# Patient Record
Sex: Male | Born: 1940
Health system: Southern US, Community
[De-identification: ages and names within clinical notes are randomized; demographics above are authoritative.]

---

## 2001-07-28 ENCOUNTER — Encounter: Payer: Self-pay | Admitting: *Deleted

## 2001-07-28 ENCOUNTER — Emergency Department (HOSPITAL_COMMUNITY): Admission: EM | Admit: 2001-07-28 | Discharge: 2001-07-28 | Payer: Self-pay | Admitting: *Deleted

## 2008-02-27 ENCOUNTER — Ambulatory Visit: Payer: Self-pay | Admitting: Internal Medicine

## 2008-02-27 ENCOUNTER — Encounter: Payer: Self-pay | Admitting: Internal Medicine

## 2008-02-27 ENCOUNTER — Ambulatory Visit (HOSPITAL_COMMUNITY): Admission: AD | Admit: 2008-02-27 | Discharge: 2008-02-27 | Payer: Self-pay | Admitting: Internal Medicine

## 2008-08-08 ENCOUNTER — Ambulatory Visit: Admission: RE | Admit: 2008-08-08 | Discharge: 2008-08-08 | Payer: Self-pay | Admitting: Internal Medicine

## 2010-09-23 NOTE — Procedures (Signed)
NAME:  Jerry Villanueva, Jerry Villanueva NO.:  [id number]   MEDICAL RECORD NO.:  [id number]          PATIENT TYPE:  OUT   LOCATION:  SLEE                          FACILITY:  [facility]   PHYSICIAN:  Kofi A. Gerilyn Pilgrim, M.D. DATE OF BIRTH:  [birth year]   DATE OF PROCEDURE:  08/08/2008  DATE OF DISCHARGE:  08/08/2008                             SLEEP DISORDER REPORT   INDICATIONS FOR PROCEDURE:  This is a 70 year old man who presents with  snoring, hypersomnia and is being evaluated for obstructive sleep apnea  syndrome.   MEDICATIONS:  Lisinopril.   EPWORTH SLEEPINESS SCALE:  1. BMI 34.   ARCHITECTURAL SUMMARY:  This is a diagnostic study.  The total recording  time is 410 minutes.  The sleep efficiency is 88%.  Sleep latency 4.5  minutes.  REM latency 70 minutes.  Stage N1 10%, N2 71%, N3 2% and REM  sleep 17%.   RESPIRATORY SUMMARY:  Oxygen saturation at baseline is 97%.  Lowest  saturation 81% during non-REM sleep.  AHI is 6.2.   LIMB MOVEMENT SUMMARY:  PLM index is 2.   ELECTROCARDIOGRAM SUMMARY:  Average heart rate is 62 with isolated PVCs  observed.   IMPRESSION:  Mild obstructive sleep apnea syndrome, not requiring  positive pressure.   Thank you for this referral.      Kofi A. Gerilyn Pilgrim, M.D.  Electronically Signed     KAD/MEDQ  D:  08/13/2008  T:  08/13/2008  Job:  [id number]

## 2010-09-23 NOTE — Op Note (Signed)
NAME:  CHRISTAIN, NIZNIK NO.:  [id number]   MEDICAL RECORD NO.:  [id number]          PATIENT TYPE:  AMB   LOCATION:  DAY                           FACILITY:  [facility]   PHYSICIAN:  R. Roetta Sessions, M.D. DATE OF BIRTH:  05-Jan-1941   DATE OF PROCEDURE:  DATE OF DISCHARGE:                               OPERATIVE REPORT   Colonoscopy with biopsy.   INDICATIONS FOR PROCEDURE:  A 70 year old gentleman, no lower GI tract  symptoms.  He has never had his lower GI tract imaged via colonoscopy or  other modality, now referred by Dr. Ouida Sills for colon cancer screening.  There is no family history of colorectal neoplasia.  Colonoscopy is now  being done as screening maneuver.  Risks, benefits, alternatives, and  limitations have been reviewed, questions answered and she is agreeable.  Please see documentation in the medical record.   PROCEDURE NOTE:  O2 saturation, blood pressure, pulse, and respirations  were monitored throughout the entire procedure.   CONSCIOUS SEDATION:  Versed 4 mg IV, Demerol 75 mg IV in divided doses.   INSTRUMENT:  Pentax video chip system.   FINDINGS:  Digital rectal exam revealed no abnormalities.  Endoscopic  findings:  The prep was good.  Colon:  Colonic mucosa was surveyed from  the rectosigmoid junction through the left transverse, right colon,  appendiceal orifice, ileocecal valve, and cecum.  These structures were  well seen and photographed for the record.  From this level, the scope  was slowly and cautiously withdrawn.  All previously mentioned mucosal  surfaces were again seen.  The colonic mucosa appeared normal except for  a 2-3 mm sessile polyp at the base of the cecum which was completely  removed with 2 passes of cold biopsy forceps.  Scope was pulled down to  the rectum.  A thorough examination of the rectal mucosa including  retroflex view of the anal verge demonstrated no abnormalities.  The  patient tolerated the procedure well, was  reactive to endoscopy.   IMPRESSION:  Normal colon, single cecal polyp status post cold biopsy  removal, remainder of colonic mucosa appeared normal.   RECOMMENDATIONS:  1. Followup on path.  2. Further recommendations to follow.      Jonathon Bellows, M.D.  Electronically Signed     RMR/MEDQ  D:  02/27/2008  T:  02/27/2008  Job:  [id number]   cc:   Kingsley Callander. Ouida Sills, MD  Fax: [phone]

## 2011-08-10 DIAGNOSIS — M47817 Spondylosis without myelopathy or radiculopathy, lumbosacral region: Secondary | ICD-10-CM | POA: Diagnosis not present

## 2011-08-14 DIAGNOSIS — M5137 Other intervertebral disc degeneration, lumbosacral region: Secondary | ICD-10-CM | POA: Diagnosis not present

## 2011-08-14 DIAGNOSIS — M5126 Other intervertebral disc displacement, lumbar region: Secondary | ICD-10-CM | POA: Diagnosis not present

## 2011-08-14 DIAGNOSIS — M4120 Other idiopathic scoliosis, site unspecified: Secondary | ICD-10-CM | POA: Diagnosis not present

## 2011-08-14 DIAGNOSIS — M47817 Spondylosis without myelopathy or radiculopathy, lumbosacral region: Secondary | ICD-10-CM | POA: Diagnosis not present

## 2011-08-19 DIAGNOSIS — N209 Urinary calculus, unspecified: Secondary | ICD-10-CM | POA: Diagnosis not present

## 2011-08-19 DIAGNOSIS — M546 Pain in thoracic spine: Secondary | ICD-10-CM | POA: Diagnosis not present

## 2011-08-19 DIAGNOSIS — M5137 Other intervertebral disc degeneration, lumbosacral region: Secondary | ICD-10-CM | POA: Diagnosis not present

## 2011-08-19 DIAGNOSIS — M47817 Spondylosis without myelopathy or radiculopathy, lumbosacral region: Secondary | ICD-10-CM | POA: Diagnosis not present

## 2011-08-19 DIAGNOSIS — M4120 Other idiopathic scoliosis, site unspecified: Secondary | ICD-10-CM | POA: Diagnosis not present

## 2011-09-07 DIAGNOSIS — M5380 Other specified dorsopathies, site unspecified: Secondary | ICD-10-CM | POA: Diagnosis not present

## 2011-09-07 DIAGNOSIS — M4120 Other idiopathic scoliosis, site unspecified: Secondary | ICD-10-CM | POA: Diagnosis not present

## 2011-09-07 DIAGNOSIS — M47817 Spondylosis without myelopathy or radiculopathy, lumbosacral region: Secondary | ICD-10-CM | POA: Diagnosis not present

## 2011-09-15 DIAGNOSIS — M4120 Other idiopathic scoliosis, site unspecified: Secondary | ICD-10-CM | POA: Diagnosis not present

## 2011-09-15 DIAGNOSIS — M5137 Other intervertebral disc degeneration, lumbosacral region: Secondary | ICD-10-CM | POA: Diagnosis not present

## 2011-09-15 DIAGNOSIS — M5380 Other specified dorsopathies, site unspecified: Secondary | ICD-10-CM | POA: Diagnosis not present

## 2011-10-07 DIAGNOSIS — M4120 Other idiopathic scoliosis, site unspecified: Secondary | ICD-10-CM | POA: Diagnosis not present

## 2011-10-07 DIAGNOSIS — M47817 Spondylosis without myelopathy or radiculopathy, lumbosacral region: Secondary | ICD-10-CM | POA: Diagnosis not present

## 2011-10-07 DIAGNOSIS — M5380 Other specified dorsopathies, site unspecified: Secondary | ICD-10-CM | POA: Diagnosis not present

## 2011-10-15 DIAGNOSIS — I10 Essential (primary) hypertension: Secondary | ICD-10-CM | POA: Diagnosis not present

## 2011-10-15 DIAGNOSIS — M5380 Other specified dorsopathies, site unspecified: Secondary | ICD-10-CM | POA: Diagnosis not present

## 2011-10-15 DIAGNOSIS — M4120 Other idiopathic scoliosis, site unspecified: Secondary | ICD-10-CM | POA: Diagnosis not present

## 2011-10-15 DIAGNOSIS — M47817 Spondylosis without myelopathy or radiculopathy, lumbosacral region: Secondary | ICD-10-CM | POA: Diagnosis not present

## 2011-10-29 DIAGNOSIS — M47817 Spondylosis without myelopathy or radiculopathy, lumbosacral region: Secondary | ICD-10-CM | POA: Diagnosis not present

## 2011-10-29 DIAGNOSIS — Z79899 Other long term (current) drug therapy: Secondary | ICD-10-CM | POA: Diagnosis not present

## 2011-10-29 DIAGNOSIS — M5137 Other intervertebral disc degeneration, lumbosacral region: Secondary | ICD-10-CM | POA: Diagnosis not present

## 2011-10-29 DIAGNOSIS — IMO0001 Reserved for inherently not codable concepts without codable children: Secondary | ICD-10-CM | POA: Diagnosis not present

## 2011-11-06 DIAGNOSIS — M4120 Other idiopathic scoliosis, site unspecified: Secondary | ICD-10-CM | POA: Diagnosis not present

## 2011-11-06 DIAGNOSIS — M47817 Spondylosis without myelopathy or radiculopathy, lumbosacral region: Secondary | ICD-10-CM | POA: Diagnosis not present

## 2011-11-06 DIAGNOSIS — M5137 Other intervertebral disc degeneration, lumbosacral region: Secondary | ICD-10-CM | POA: Diagnosis not present

## 2011-11-06 DIAGNOSIS — Z79899 Other long term (current) drug therapy: Secondary | ICD-10-CM | POA: Diagnosis not present

## 2012-04-06 DIAGNOSIS — E785 Hyperlipidemia, unspecified: Secondary | ICD-10-CM | POA: Diagnosis not present

## 2012-04-06 DIAGNOSIS — Z79899 Other long term (current) drug therapy: Secondary | ICD-10-CM | POA: Diagnosis not present

## 2012-04-06 DIAGNOSIS — I10 Essential (primary) hypertension: Secondary | ICD-10-CM | POA: Diagnosis not present

## 2012-04-15 DIAGNOSIS — M545 Low back pain: Secondary | ICD-10-CM | POA: Diagnosis not present

## 2012-04-15 DIAGNOSIS — I10 Essential (primary) hypertension: Secondary | ICD-10-CM | POA: Diagnosis not present

## 2012-04-28 DIAGNOSIS — M47817 Spondylosis without myelopathy or radiculopathy, lumbosacral region: Secondary | ICD-10-CM | POA: Diagnosis not present

## 2012-04-28 DIAGNOSIS — M4120 Other idiopathic scoliosis, site unspecified: Secondary | ICD-10-CM | POA: Diagnosis not present

## 2012-04-28 DIAGNOSIS — M5380 Other specified dorsopathies, site unspecified: Secondary | ICD-10-CM | POA: Diagnosis not present

## 2012-06-30 DIAGNOSIS — M545 Low back pain: Secondary | ICD-10-CM | POA: Diagnosis not present

## 2012-06-30 DIAGNOSIS — I10 Essential (primary) hypertension: Secondary | ICD-10-CM | POA: Diagnosis not present

## 2012-08-18 DIAGNOSIS — M4120 Other idiopathic scoliosis, site unspecified: Secondary | ICD-10-CM | POA: Diagnosis not present

## 2012-08-18 DIAGNOSIS — M47817 Spondylosis without myelopathy or radiculopathy, lumbosacral region: Secondary | ICD-10-CM | POA: Diagnosis not present

## 2012-10-07 DIAGNOSIS — E669 Obesity, unspecified: Secondary | ICD-10-CM | POA: Diagnosis not present

## 2012-10-07 DIAGNOSIS — M47817 Spondylosis without myelopathy or radiculopathy, lumbosacral region: Secondary | ICD-10-CM | POA: Diagnosis not present

## 2012-10-07 DIAGNOSIS — Z79899 Other long term (current) drug therapy: Secondary | ICD-10-CM | POA: Diagnosis not present

## 2012-10-07 DIAGNOSIS — J9819 Other pulmonary collapse: Secondary | ICD-10-CM | POA: Diagnosis not present

## 2012-10-07 DIAGNOSIS — Z01818 Encounter for other preprocedural examination: Secondary | ICD-10-CM | POA: Diagnosis not present

## 2012-10-07 DIAGNOSIS — I10 Essential (primary) hypertension: Secondary | ICD-10-CM | POA: Diagnosis not present

## 2012-10-11 DIAGNOSIS — M4310 Spondylolisthesis, site unspecified: Secondary | ICD-10-CM | POA: Diagnosis not present

## 2012-10-11 DIAGNOSIS — S2092XA Blister (nonthermal) of unspecified parts of thorax, initial encounter: Secondary | ICD-10-CM | POA: Diagnosis not present

## 2012-10-11 DIAGNOSIS — Z6841 Body Mass Index (BMI) 40.0 and over, adult: Secondary | ICD-10-CM | POA: Diagnosis not present

## 2012-10-11 DIAGNOSIS — M539 Dorsopathy, unspecified: Secondary | ICD-10-CM | POA: Diagnosis not present

## 2012-10-11 DIAGNOSIS — Z79899 Other long term (current) drug therapy: Secondary | ICD-10-CM | POA: Diagnosis not present

## 2012-10-11 DIAGNOSIS — M4120 Other idiopathic scoliosis, site unspecified: Secondary | ICD-10-CM | POA: Diagnosis not present

## 2012-10-11 DIAGNOSIS — R0609 Other forms of dyspnea: Secondary | ICD-10-CM | POA: Diagnosis not present

## 2012-10-11 DIAGNOSIS — I10 Essential (primary) hypertension: Secondary | ICD-10-CM | POA: Diagnosis not present

## 2012-10-11 DIAGNOSIS — M47817 Spondylosis without myelopathy or radiculopathy, lumbosacral region: Secondary | ICD-10-CM | POA: Diagnosis not present

## 2012-10-11 DIAGNOSIS — M959 Acquired deformity of musculoskeletal system, unspecified: Secondary | ICD-10-CM | POA: Diagnosis not present

## 2012-10-11 DIAGNOSIS — R4182 Altered mental status, unspecified: Secondary | ICD-10-CM | POA: Diagnosis not present

## 2012-10-11 DIAGNOSIS — Z833 Family history of diabetes mellitus: Secondary | ICD-10-CM | POA: Diagnosis not present

## 2012-10-18 DIAGNOSIS — Z5189 Encounter for other specified aftercare: Secondary | ICD-10-CM | POA: Diagnosis not present

## 2012-10-18 DIAGNOSIS — Z4789 Encounter for other orthopedic aftercare: Secondary | ICD-10-CM | POA: Diagnosis not present

## 2012-10-19 DIAGNOSIS — Z5189 Encounter for other specified aftercare: Secondary | ICD-10-CM | POA: Diagnosis not present

## 2012-10-19 DIAGNOSIS — Z4789 Encounter for other orthopedic aftercare: Secondary | ICD-10-CM | POA: Diagnosis not present

## 2012-10-21 DIAGNOSIS — M5137 Other intervertebral disc degeneration, lumbosacral region: Secondary | ICD-10-CM | POA: Diagnosis not present

## 2012-10-21 DIAGNOSIS — Z981 Arthrodesis status: Secondary | ICD-10-CM | POA: Diagnosis not present

## 2012-10-21 DIAGNOSIS — M47817 Spondylosis without myelopathy or radiculopathy, lumbosacral region: Secondary | ICD-10-CM | POA: Diagnosis not present

## 2012-10-24 DIAGNOSIS — Z5189 Encounter for other specified aftercare: Secondary | ICD-10-CM | POA: Diagnosis not present

## 2012-10-24 DIAGNOSIS — Z4789 Encounter for other orthopedic aftercare: Secondary | ICD-10-CM | POA: Diagnosis not present

## 2012-10-26 DIAGNOSIS — Z5189 Encounter for other specified aftercare: Secondary | ICD-10-CM | POA: Diagnosis not present

## 2012-10-26 DIAGNOSIS — Z4789 Encounter for other orthopedic aftercare: Secondary | ICD-10-CM | POA: Diagnosis not present

## 2012-10-27 DIAGNOSIS — Z4789 Encounter for other orthopedic aftercare: Secondary | ICD-10-CM | POA: Diagnosis not present

## 2012-10-27 DIAGNOSIS — Z5189 Encounter for other specified aftercare: Secondary | ICD-10-CM | POA: Diagnosis not present

## 2012-10-28 DIAGNOSIS — M7989 Other specified soft tissue disorders: Secondary | ICD-10-CM | POA: Diagnosis not present

## 2012-10-28 DIAGNOSIS — M5380 Other specified dorsopathies, site unspecified: Secondary | ICD-10-CM | POA: Diagnosis not present

## 2012-10-28 DIAGNOSIS — M79609 Pain in unspecified limb: Secondary | ICD-10-CM | POA: Diagnosis not present

## 2012-10-30 DIAGNOSIS — Z5189 Encounter for other specified aftercare: Secondary | ICD-10-CM | POA: Diagnosis not present

## 2012-10-30 DIAGNOSIS — Z4789 Encounter for other orthopedic aftercare: Secondary | ICD-10-CM | POA: Diagnosis not present

## 2012-11-03 DIAGNOSIS — Z5189 Encounter for other specified aftercare: Secondary | ICD-10-CM | POA: Diagnosis not present

## 2012-11-03 DIAGNOSIS — Z4789 Encounter for other orthopedic aftercare: Secondary | ICD-10-CM | POA: Diagnosis not present

## 2012-11-04 DIAGNOSIS — M47817 Spondylosis without myelopathy or radiculopathy, lumbosacral region: Secondary | ICD-10-CM | POA: Diagnosis not present

## 2012-11-04 DIAGNOSIS — M4120 Other idiopathic scoliosis, site unspecified: Secondary | ICD-10-CM | POA: Diagnosis not present

## 2012-11-04 DIAGNOSIS — Z981 Arthrodesis status: Secondary | ICD-10-CM | POA: Diagnosis not present

## 2012-11-08 DIAGNOSIS — Z5189 Encounter for other specified aftercare: Secondary | ICD-10-CM | POA: Diagnosis not present

## 2012-11-08 DIAGNOSIS — Z4789 Encounter for other orthopedic aftercare: Secondary | ICD-10-CM | POA: Diagnosis not present

## 2012-11-09 DIAGNOSIS — Z5189 Encounter for other specified aftercare: Secondary | ICD-10-CM | POA: Diagnosis not present

## 2012-11-09 DIAGNOSIS — Z4789 Encounter for other orthopedic aftercare: Secondary | ICD-10-CM | POA: Diagnosis not present

## 2012-11-10 DIAGNOSIS — Z4789 Encounter for other orthopedic aftercare: Secondary | ICD-10-CM | POA: Diagnosis not present

## 2012-11-10 DIAGNOSIS — Z5189 Encounter for other specified aftercare: Secondary | ICD-10-CM | POA: Diagnosis not present

## 2012-11-11 DIAGNOSIS — Z4789 Encounter for other orthopedic aftercare: Secondary | ICD-10-CM | POA: Diagnosis not present

## 2012-11-11 DIAGNOSIS — Z5189 Encounter for other specified aftercare: Secondary | ICD-10-CM | POA: Diagnosis not present

## 2012-11-15 DIAGNOSIS — Z5189 Encounter for other specified aftercare: Secondary | ICD-10-CM | POA: Diagnosis not present

## 2012-11-15 DIAGNOSIS — Z4789 Encounter for other orthopedic aftercare: Secondary | ICD-10-CM | POA: Diagnosis not present

## 2012-11-17 DIAGNOSIS — Z5189 Encounter for other specified aftercare: Secondary | ICD-10-CM | POA: Diagnosis not present

## 2012-11-17 DIAGNOSIS — Z4789 Encounter for other orthopedic aftercare: Secondary | ICD-10-CM | POA: Diagnosis not present

## 2012-11-22 DIAGNOSIS — Z4789 Encounter for other orthopedic aftercare: Secondary | ICD-10-CM | POA: Diagnosis not present

## 2012-11-22 DIAGNOSIS — Z5189 Encounter for other specified aftercare: Secondary | ICD-10-CM | POA: Diagnosis not present

## 2012-11-24 DIAGNOSIS — Z5189 Encounter for other specified aftercare: Secondary | ICD-10-CM | POA: Diagnosis not present

## 2012-11-24 DIAGNOSIS — Z4789 Encounter for other orthopedic aftercare: Secondary | ICD-10-CM | POA: Diagnosis not present

## 2012-11-29 DIAGNOSIS — Z5189 Encounter for other specified aftercare: Secondary | ICD-10-CM | POA: Diagnosis not present

## 2012-11-29 DIAGNOSIS — Z4789 Encounter for other orthopedic aftercare: Secondary | ICD-10-CM | POA: Diagnosis not present

## 2012-12-02 DIAGNOSIS — Z4789 Encounter for other orthopedic aftercare: Secondary | ICD-10-CM | POA: Diagnosis not present

## 2012-12-02 DIAGNOSIS — Z5189 Encounter for other specified aftercare: Secondary | ICD-10-CM | POA: Diagnosis not present

## 2012-12-07 DIAGNOSIS — Z5189 Encounter for other specified aftercare: Secondary | ICD-10-CM | POA: Diagnosis not present

## 2012-12-07 DIAGNOSIS — Z4789 Encounter for other orthopedic aftercare: Secondary | ICD-10-CM | POA: Diagnosis not present

## 2012-12-08 DIAGNOSIS — M48061 Spinal stenosis, lumbar region without neurogenic claudication: Secondary | ICD-10-CM | POA: Diagnosis not present

## 2012-12-08 DIAGNOSIS — M47817 Spondylosis without myelopathy or radiculopathy, lumbosacral region: Secondary | ICD-10-CM | POA: Diagnosis not present

## 2012-12-08 DIAGNOSIS — Z981 Arthrodesis status: Secondary | ICD-10-CM | POA: Diagnosis not present

## 2012-12-16 DIAGNOSIS — R262 Difficulty in walking, not elsewhere classified: Secondary | ICD-10-CM | POA: Diagnosis not present

## 2012-12-16 DIAGNOSIS — M545 Low back pain: Secondary | ICD-10-CM | POA: Diagnosis not present

## 2012-12-19 DIAGNOSIS — M545 Low back pain: Secondary | ICD-10-CM | POA: Diagnosis not present

## 2012-12-19 DIAGNOSIS — R262 Difficulty in walking, not elsewhere classified: Secondary | ICD-10-CM | POA: Diagnosis not present

## 2012-12-22 DIAGNOSIS — R262 Difficulty in walking, not elsewhere classified: Secondary | ICD-10-CM | POA: Diagnosis not present

## 2012-12-22 DIAGNOSIS — M545 Low back pain: Secondary | ICD-10-CM | POA: Diagnosis not present

## 2012-12-26 DIAGNOSIS — M545 Low back pain: Secondary | ICD-10-CM | POA: Diagnosis not present

## 2012-12-26 DIAGNOSIS — R262 Difficulty in walking, not elsewhere classified: Secondary | ICD-10-CM | POA: Diagnosis not present

## 2012-12-28 DIAGNOSIS — M545 Low back pain: Secondary | ICD-10-CM | POA: Diagnosis not present

## 2012-12-28 DIAGNOSIS — R262 Difficulty in walking, not elsewhere classified: Secondary | ICD-10-CM | POA: Diagnosis not present

## 2013-01-03 DIAGNOSIS — M545 Low back pain: Secondary | ICD-10-CM | POA: Diagnosis not present

## 2013-01-03 DIAGNOSIS — R262 Difficulty in walking, not elsewhere classified: Secondary | ICD-10-CM | POA: Diagnosis not present

## 2013-01-05 DIAGNOSIS — M545 Low back pain: Secondary | ICD-10-CM | POA: Diagnosis not present

## 2013-01-05 DIAGNOSIS — R262 Difficulty in walking, not elsewhere classified: Secondary | ICD-10-CM | POA: Diagnosis not present

## 2013-01-10 DIAGNOSIS — R262 Difficulty in walking, not elsewhere classified: Secondary | ICD-10-CM | POA: Diagnosis not present

## 2013-01-10 DIAGNOSIS — M545 Low back pain: Secondary | ICD-10-CM | POA: Diagnosis not present

## 2013-01-11 DIAGNOSIS — R262 Difficulty in walking, not elsewhere classified: Secondary | ICD-10-CM | POA: Diagnosis not present

## 2013-01-11 DIAGNOSIS — M545 Low back pain: Secondary | ICD-10-CM | POA: Diagnosis not present

## 2013-01-12 DIAGNOSIS — L738 Other specified follicular disorders: Secondary | ICD-10-CM | POA: Diagnosis not present

## 2013-01-12 DIAGNOSIS — D235 Other benign neoplasm of skin of trunk: Secondary | ICD-10-CM | POA: Diagnosis not present

## 2013-01-16 DIAGNOSIS — M545 Low back pain: Secondary | ICD-10-CM | POA: Diagnosis not present

## 2013-01-16 DIAGNOSIS — R262 Difficulty in walking, not elsewhere classified: Secondary | ICD-10-CM | POA: Diagnosis not present

## 2013-01-18 DIAGNOSIS — R262 Difficulty in walking, not elsewhere classified: Secondary | ICD-10-CM | POA: Diagnosis not present

## 2013-01-18 DIAGNOSIS — M545 Low back pain: Secondary | ICD-10-CM | POA: Diagnosis not present

## 2013-01-23 DIAGNOSIS — M545 Low back pain: Secondary | ICD-10-CM | POA: Diagnosis not present

## 2013-01-23 DIAGNOSIS — R262 Difficulty in walking, not elsewhere classified: Secondary | ICD-10-CM | POA: Diagnosis not present

## 2013-01-25 DIAGNOSIS — R262 Difficulty in walking, not elsewhere classified: Secondary | ICD-10-CM | POA: Diagnosis not present

## 2013-01-25 DIAGNOSIS — M545 Low back pain: Secondary | ICD-10-CM | POA: Diagnosis not present

## 2013-01-30 DIAGNOSIS — R262 Difficulty in walking, not elsewhere classified: Secondary | ICD-10-CM | POA: Diagnosis not present

## 2013-01-30 DIAGNOSIS — M545 Low back pain: Secondary | ICD-10-CM | POA: Diagnosis not present

## 2013-02-01 DIAGNOSIS — R262 Difficulty in walking, not elsewhere classified: Secondary | ICD-10-CM | POA: Diagnosis not present

## 2013-02-01 DIAGNOSIS — M545 Low back pain: Secondary | ICD-10-CM | POA: Diagnosis not present

## 2013-02-06 DIAGNOSIS — R262 Difficulty in walking, not elsewhere classified: Secondary | ICD-10-CM | POA: Diagnosis not present

## 2013-02-06 DIAGNOSIS — M545 Low back pain: Secondary | ICD-10-CM | POA: Diagnosis not present

## 2013-02-08 DIAGNOSIS — M545 Low back pain: Secondary | ICD-10-CM | POA: Diagnosis not present

## 2013-02-08 DIAGNOSIS — R262 Difficulty in walking, not elsewhere classified: Secondary | ICD-10-CM | POA: Diagnosis not present

## 2013-02-13 DIAGNOSIS — M545 Low back pain: Secondary | ICD-10-CM | POA: Diagnosis not present

## 2013-02-13 DIAGNOSIS — R262 Difficulty in walking, not elsewhere classified: Secondary | ICD-10-CM | POA: Diagnosis not present

## 2013-02-15 DIAGNOSIS — R262 Difficulty in walking, not elsewhere classified: Secondary | ICD-10-CM | POA: Diagnosis not present

## 2013-02-15 DIAGNOSIS — M545 Low back pain: Secondary | ICD-10-CM | POA: Diagnosis not present

## 2013-02-20 DIAGNOSIS — M545 Low back pain: Secondary | ICD-10-CM | POA: Diagnosis not present

## 2013-02-20 DIAGNOSIS — R262 Difficulty in walking, not elsewhere classified: Secondary | ICD-10-CM | POA: Diagnosis not present

## 2013-02-22 DIAGNOSIS — Z23 Encounter for immunization: Secondary | ICD-10-CM | POA: Diagnosis not present

## 2013-02-22 DIAGNOSIS — R262 Difficulty in walking, not elsewhere classified: Secondary | ICD-10-CM | POA: Diagnosis not present

## 2013-02-22 DIAGNOSIS — Z981 Arthrodesis status: Secondary | ICD-10-CM | POA: Diagnosis not present

## 2013-02-22 DIAGNOSIS — M545 Low back pain: Secondary | ICD-10-CM | POA: Diagnosis not present

## 2013-02-22 DIAGNOSIS — M1710 Unilateral primary osteoarthritis, unspecified knee: Secondary | ICD-10-CM | POA: Diagnosis not present

## 2013-02-22 DIAGNOSIS — M25569 Pain in unspecified knee: Secondary | ICD-10-CM | POA: Diagnosis not present

## 2013-02-22 DIAGNOSIS — M169 Osteoarthritis of hip, unspecified: Secondary | ICD-10-CM | POA: Diagnosis not present

## 2013-02-22 DIAGNOSIS — M47817 Spondylosis without myelopathy or radiculopathy, lumbosacral region: Secondary | ICD-10-CM | POA: Diagnosis not present

## 2013-02-28 DIAGNOSIS — M545 Low back pain: Secondary | ICD-10-CM | POA: Diagnosis not present

## 2013-02-28 DIAGNOSIS — R262 Difficulty in walking, not elsewhere classified: Secondary | ICD-10-CM | POA: Diagnosis not present

## 2013-03-02 DIAGNOSIS — M47817 Spondylosis without myelopathy or radiculopathy, lumbosacral region: Secondary | ICD-10-CM | POA: Diagnosis not present

## 2013-03-02 DIAGNOSIS — M545 Low back pain: Secondary | ICD-10-CM | POA: Diagnosis not present

## 2013-03-02 DIAGNOSIS — R262 Difficulty in walking, not elsewhere classified: Secondary | ICD-10-CM | POA: Diagnosis not present

## 2013-03-15 DIAGNOSIS — M545 Low back pain: Secondary | ICD-10-CM | POA: Diagnosis not present

## 2013-03-15 DIAGNOSIS — R262 Difficulty in walking, not elsewhere classified: Secondary | ICD-10-CM | POA: Diagnosis not present

## 2013-06-08 DIAGNOSIS — M47817 Spondylosis without myelopathy or radiculopathy, lumbosacral region: Secondary | ICD-10-CM | POA: Diagnosis not present

## 2013-06-08 DIAGNOSIS — M5380 Other specified dorsopathies, site unspecified: Secondary | ICD-10-CM | POA: Diagnosis not present

## 2013-06-08 DIAGNOSIS — M4120 Other idiopathic scoliosis, site unspecified: Secondary | ICD-10-CM | POA: Diagnosis not present

## 2013-09-07 DIAGNOSIS — M5380 Other specified dorsopathies, site unspecified: Secondary | ICD-10-CM | POA: Diagnosis not present

## 2013-09-07 DIAGNOSIS — M4120 Other idiopathic scoliosis, site unspecified: Secondary | ICD-10-CM | POA: Diagnosis not present

## 2013-09-07 DIAGNOSIS — M47817 Spondylosis without myelopathy or radiculopathy, lumbosacral region: Secondary | ICD-10-CM | POA: Diagnosis not present

## 2013-12-19 DIAGNOSIS — M4120 Other idiopathic scoliosis, site unspecified: Secondary | ICD-10-CM | POA: Diagnosis not present

## 2013-12-19 DIAGNOSIS — M5380 Other specified dorsopathies, site unspecified: Secondary | ICD-10-CM | POA: Diagnosis not present

## 2013-12-19 DIAGNOSIS — M47817 Spondylosis without myelopathy or radiculopathy, lumbosacral region: Secondary | ICD-10-CM | POA: Diagnosis not present

## 2014-02-27 DIAGNOSIS — Z23 Encounter for immunization: Secondary | ICD-10-CM | POA: Diagnosis not present

## 2014-06-27 DIAGNOSIS — M419 Scoliosis, unspecified: Secondary | ICD-10-CM | POA: Diagnosis not present

## 2014-06-27 DIAGNOSIS — M545 Low back pain: Secondary | ICD-10-CM | POA: Diagnosis not present

## 2014-06-27 DIAGNOSIS — M179 Osteoarthritis of knee, unspecified: Secondary | ICD-10-CM | POA: Diagnosis not present

## 2014-06-27 DIAGNOSIS — M47817 Spondylosis without myelopathy or radiculopathy, lumbosacral region: Secondary | ICD-10-CM | POA: Diagnosis not present

## 2014-12-26 DIAGNOSIS — M47816 Spondylosis without myelopathy or radiculopathy, lumbar region: Secondary | ICD-10-CM | POA: Diagnosis not present

## 2014-12-26 DIAGNOSIS — M419 Scoliosis, unspecified: Secondary | ICD-10-CM | POA: Diagnosis not present

## 2014-12-26 DIAGNOSIS — M545 Low back pain: Secondary | ICD-10-CM | POA: Diagnosis not present

## 2015-02-23 DIAGNOSIS — Z23 Encounter for immunization: Secondary | ICD-10-CM | POA: Diagnosis not present

## 2015-10-24 DIAGNOSIS — M5136 Other intervertebral disc degeneration, lumbar region: Secondary | ICD-10-CM | POA: Diagnosis not present

## 2015-10-24 DIAGNOSIS — M5135 Other intervertebral disc degeneration, thoracolumbar region: Secondary | ICD-10-CM | POA: Diagnosis not present

## 2015-10-24 DIAGNOSIS — M47816 Spondylosis without myelopathy or radiculopathy, lumbar region: Secondary | ICD-10-CM | POA: Diagnosis not present

## 2015-10-24 DIAGNOSIS — Z9889 Other specified postprocedural states: Secondary | ICD-10-CM | POA: Diagnosis not present

## 2015-10-24 DIAGNOSIS — M419 Scoliosis, unspecified: Secondary | ICD-10-CM | POA: Diagnosis not present

## 2015-10-24 DIAGNOSIS — M545 Low back pain: Secondary | ICD-10-CM | POA: Diagnosis not present

## 2015-10-31 DIAGNOSIS — Z981 Arthrodesis status: Secondary | ICD-10-CM | POA: Diagnosis not present

## 2015-10-31 DIAGNOSIS — M5124 Other intervertebral disc displacement, thoracic region: Secondary | ICD-10-CM | POA: Diagnosis not present

## 2015-10-31 DIAGNOSIS — E041 Nontoxic single thyroid nodule: Secondary | ICD-10-CM | POA: Diagnosis not present

## 2015-10-31 DIAGNOSIS — M4806 Spinal stenosis, lumbar region: Secondary | ICD-10-CM | POA: Diagnosis not present

## 2015-10-31 DIAGNOSIS — N200 Calculus of kidney: Secondary | ICD-10-CM | POA: Diagnosis not present

## 2015-10-31 DIAGNOSIS — M419 Scoliosis, unspecified: Secondary | ICD-10-CM | POA: Diagnosis not present

## 2015-10-31 DIAGNOSIS — M4804 Spinal stenosis, thoracic region: Secondary | ICD-10-CM | POA: Diagnosis not present

## 2015-10-31 DIAGNOSIS — M47816 Spondylosis without myelopathy or radiculopathy, lumbar region: Secondary | ICD-10-CM | POA: Diagnosis not present

## 2015-11-18 DIAGNOSIS — M1711 Unilateral primary osteoarthritis, right knee: Secondary | ICD-10-CM | POA: Diagnosis not present

## 2015-11-25 DIAGNOSIS — M47816 Spondylosis without myelopathy or radiculopathy, lumbar region: Secondary | ICD-10-CM | POA: Diagnosis not present

## 2015-11-25 DIAGNOSIS — M545 Low back pain: Secondary | ICD-10-CM | POA: Diagnosis not present

## 2015-11-25 DIAGNOSIS — M419 Scoliosis, unspecified: Secondary | ICD-10-CM | POA: Diagnosis not present

## 2015-11-29 DIAGNOSIS — M545 Low back pain: Secondary | ICD-10-CM | POA: Diagnosis not present

## 2015-11-29 DIAGNOSIS — M79604 Pain in right leg: Secondary | ICD-10-CM | POA: Diagnosis not present

## 2015-11-29 DIAGNOSIS — M5416 Radiculopathy, lumbar region: Secondary | ICD-10-CM | POA: Diagnosis not present

## 2015-11-29 DIAGNOSIS — M546 Pain in thoracic spine: Secondary | ICD-10-CM | POA: Diagnosis not present

## 2015-12-02 DIAGNOSIS — M79604 Pain in right leg: Secondary | ICD-10-CM | POA: Diagnosis not present

## 2015-12-02 DIAGNOSIS — M546 Pain in thoracic spine: Secondary | ICD-10-CM | POA: Diagnosis not present

## 2015-12-02 DIAGNOSIS — M545 Low back pain: Secondary | ICD-10-CM | POA: Diagnosis not present

## 2015-12-02 DIAGNOSIS — M5416 Radiculopathy, lumbar region: Secondary | ICD-10-CM | POA: Diagnosis not present

## 2015-12-03 DIAGNOSIS — R202 Paresthesia of skin: Secondary | ICD-10-CM | POA: Diagnosis not present

## 2015-12-03 DIAGNOSIS — M79651 Pain in right thigh: Secondary | ICD-10-CM | POA: Diagnosis not present

## 2015-12-03 DIAGNOSIS — M62551 Muscle wasting and atrophy, not elsewhere classified, right thigh: Secondary | ICD-10-CM | POA: Diagnosis not present

## 2015-12-03 DIAGNOSIS — M62451 Contracture of muscle, right thigh: Secondary | ICD-10-CM | POA: Diagnosis not present

## 2015-12-04 DIAGNOSIS — M62551 Muscle wasting and atrophy, not elsewhere classified, right thigh: Secondary | ICD-10-CM | POA: Diagnosis not present

## 2015-12-04 DIAGNOSIS — M79651 Pain in right thigh: Secondary | ICD-10-CM | POA: Diagnosis not present

## 2015-12-04 DIAGNOSIS — M62451 Contracture of muscle, right thigh: Secondary | ICD-10-CM | POA: Diagnosis not present

## 2015-12-04 DIAGNOSIS — R202 Paresthesia of skin: Secondary | ICD-10-CM | POA: Diagnosis not present

## 2015-12-05 DIAGNOSIS — M5416 Radiculopathy, lumbar region: Secondary | ICD-10-CM | POA: Diagnosis not present

## 2015-12-05 DIAGNOSIS — M545 Low back pain: Secondary | ICD-10-CM | POA: Diagnosis not present

## 2015-12-05 DIAGNOSIS — M546 Pain in thoracic spine: Secondary | ICD-10-CM | POA: Diagnosis not present

## 2015-12-05 DIAGNOSIS — M79604 Pain in right leg: Secondary | ICD-10-CM | POA: Diagnosis not present

## 2015-12-09 DIAGNOSIS — M545 Low back pain: Secondary | ICD-10-CM | POA: Diagnosis not present

## 2015-12-09 DIAGNOSIS — M546 Pain in thoracic spine: Secondary | ICD-10-CM | POA: Diagnosis not present

## 2015-12-09 DIAGNOSIS — M79604 Pain in right leg: Secondary | ICD-10-CM | POA: Diagnosis not present

## 2015-12-09 DIAGNOSIS — M5416 Radiculopathy, lumbar region: Secondary | ICD-10-CM | POA: Diagnosis not present

## 2015-12-11 DIAGNOSIS — M5416 Radiculopathy, lumbar region: Secondary | ICD-10-CM | POA: Diagnosis not present

## 2015-12-11 DIAGNOSIS — M545 Low back pain: Secondary | ICD-10-CM | POA: Diagnosis not present

## 2015-12-11 DIAGNOSIS — M546 Pain in thoracic spine: Secondary | ICD-10-CM | POA: Diagnosis not present

## 2015-12-11 DIAGNOSIS — M79604 Pain in right leg: Secondary | ICD-10-CM | POA: Diagnosis not present

## 2015-12-13 DIAGNOSIS — M79604 Pain in right leg: Secondary | ICD-10-CM | POA: Diagnosis not present

## 2015-12-13 DIAGNOSIS — M5416 Radiculopathy, lumbar region: Secondary | ICD-10-CM | POA: Diagnosis not present

## 2015-12-13 DIAGNOSIS — M546 Pain in thoracic spine: Secondary | ICD-10-CM | POA: Diagnosis not present

## 2015-12-13 DIAGNOSIS — M545 Low back pain: Secondary | ICD-10-CM | POA: Diagnosis not present

## 2015-12-16 DIAGNOSIS — M546 Pain in thoracic spine: Secondary | ICD-10-CM | POA: Diagnosis not present

## 2015-12-16 DIAGNOSIS — M545 Low back pain: Secondary | ICD-10-CM | POA: Diagnosis not present

## 2015-12-16 DIAGNOSIS — M79604 Pain in right leg: Secondary | ICD-10-CM | POA: Diagnosis not present

## 2015-12-16 DIAGNOSIS — M5416 Radiculopathy, lumbar region: Secondary | ICD-10-CM | POA: Diagnosis not present

## 2015-12-18 DIAGNOSIS — M5416 Radiculopathy, lumbar region: Secondary | ICD-10-CM | POA: Diagnosis not present

## 2015-12-18 DIAGNOSIS — M79604 Pain in right leg: Secondary | ICD-10-CM | POA: Diagnosis not present

## 2015-12-18 DIAGNOSIS — M546 Pain in thoracic spine: Secondary | ICD-10-CM | POA: Diagnosis not present

## 2015-12-18 DIAGNOSIS — M545 Low back pain: Secondary | ICD-10-CM | POA: Diagnosis not present

## 2015-12-20 DIAGNOSIS — M545 Low back pain: Secondary | ICD-10-CM | POA: Diagnosis not present

## 2015-12-20 DIAGNOSIS — M5416 Radiculopathy, lumbar region: Secondary | ICD-10-CM | POA: Diagnosis not present

## 2015-12-20 DIAGNOSIS — M546 Pain in thoracic spine: Secondary | ICD-10-CM | POA: Diagnosis not present

## 2015-12-20 DIAGNOSIS — M79604 Pain in right leg: Secondary | ICD-10-CM | POA: Diagnosis not present

## 2015-12-23 DIAGNOSIS — M545 Low back pain: Secondary | ICD-10-CM | POA: Diagnosis not present

## 2015-12-23 DIAGNOSIS — M79604 Pain in right leg: Secondary | ICD-10-CM | POA: Diagnosis not present

## 2015-12-23 DIAGNOSIS — M5416 Radiculopathy, lumbar region: Secondary | ICD-10-CM | POA: Diagnosis not present

## 2015-12-23 DIAGNOSIS — M546 Pain in thoracic spine: Secondary | ICD-10-CM | POA: Diagnosis not present

## 2015-12-24 DIAGNOSIS — R29898 Other symptoms and signs involving the musculoskeletal system: Secondary | ICD-10-CM | POA: Diagnosis not present

## 2015-12-25 DIAGNOSIS — M5136 Other intervertebral disc degeneration, lumbar region: Secondary | ICD-10-CM | POA: Diagnosis not present

## 2015-12-25 DIAGNOSIS — M79604 Pain in right leg: Secondary | ICD-10-CM | POA: Diagnosis not present

## 2015-12-25 DIAGNOSIS — I10 Essential (primary) hypertension: Secondary | ICD-10-CM | POA: Diagnosis not present

## 2015-12-25 DIAGNOSIS — M5416 Radiculopathy, lumbar region: Secondary | ICD-10-CM | POA: Diagnosis not present

## 2015-12-25 DIAGNOSIS — Z6834 Body mass index (BMI) 34.0-34.9, adult: Secondary | ICD-10-CM | POA: Diagnosis not present

## 2015-12-25 DIAGNOSIS — M545 Low back pain: Secondary | ICD-10-CM | POA: Diagnosis not present

## 2015-12-25 DIAGNOSIS — M546 Pain in thoracic spine: Secondary | ICD-10-CM | POA: Diagnosis not present

## 2015-12-25 DIAGNOSIS — Z23 Encounter for immunization: Secondary | ICD-10-CM | POA: Diagnosis not present

## 2015-12-30 DIAGNOSIS — M79604 Pain in right leg: Secondary | ICD-10-CM | POA: Diagnosis not present

## 2015-12-30 DIAGNOSIS — M5416 Radiculopathy, lumbar region: Secondary | ICD-10-CM | POA: Diagnosis not present

## 2015-12-30 DIAGNOSIS — M545 Low back pain: Secondary | ICD-10-CM | POA: Diagnosis not present

## 2015-12-30 DIAGNOSIS — M546 Pain in thoracic spine: Secondary | ICD-10-CM | POA: Diagnosis not present

## 2016-01-01 DIAGNOSIS — M5416 Radiculopathy, lumbar region: Secondary | ICD-10-CM | POA: Diagnosis not present

## 2016-01-01 DIAGNOSIS — M546 Pain in thoracic spine: Secondary | ICD-10-CM | POA: Diagnosis not present

## 2016-01-01 DIAGNOSIS — M545 Low back pain: Secondary | ICD-10-CM | POA: Diagnosis not present

## 2016-01-01 DIAGNOSIS — M79604 Pain in right leg: Secondary | ICD-10-CM | POA: Diagnosis not present

## 2016-01-06 DIAGNOSIS — M5416 Radiculopathy, lumbar region: Secondary | ICD-10-CM | POA: Diagnosis not present

## 2016-01-06 DIAGNOSIS — M79604 Pain in right leg: Secondary | ICD-10-CM | POA: Diagnosis not present

## 2016-01-06 DIAGNOSIS — M419 Scoliosis, unspecified: Secondary | ICD-10-CM | POA: Diagnosis not present

## 2016-01-06 DIAGNOSIS — M545 Low back pain: Secondary | ICD-10-CM | POA: Diagnosis not present

## 2016-01-06 DIAGNOSIS — M546 Pain in thoracic spine: Secondary | ICD-10-CM | POA: Diagnosis not present

## 2016-01-06 DIAGNOSIS — M47816 Spondylosis without myelopathy or radiculopathy, lumbar region: Secondary | ICD-10-CM | POA: Diagnosis not present

## 2016-01-09 DIAGNOSIS — M546 Pain in thoracic spine: Secondary | ICD-10-CM | POA: Diagnosis not present

## 2016-01-09 DIAGNOSIS — M545 Low back pain: Secondary | ICD-10-CM | POA: Diagnosis not present

## 2016-01-09 DIAGNOSIS — M5416 Radiculopathy, lumbar region: Secondary | ICD-10-CM | POA: Diagnosis not present

## 2016-01-09 DIAGNOSIS — M79604 Pain in right leg: Secondary | ICD-10-CM | POA: Diagnosis not present

## 2016-01-15 DIAGNOSIS — M545 Low back pain: Secondary | ICD-10-CM | POA: Diagnosis not present

## 2016-01-15 DIAGNOSIS — M79604 Pain in right leg: Secondary | ICD-10-CM | POA: Diagnosis not present

## 2016-01-15 DIAGNOSIS — M546 Pain in thoracic spine: Secondary | ICD-10-CM | POA: Diagnosis not present

## 2016-01-15 DIAGNOSIS — M5416 Radiculopathy, lumbar region: Secondary | ICD-10-CM | POA: Diagnosis not present

## 2016-01-17 DIAGNOSIS — M79604 Pain in right leg: Secondary | ICD-10-CM | POA: Diagnosis not present

## 2016-01-17 DIAGNOSIS — M5416 Radiculopathy, lumbar region: Secondary | ICD-10-CM | POA: Diagnosis not present

## 2016-01-17 DIAGNOSIS — M546 Pain in thoracic spine: Secondary | ICD-10-CM | POA: Diagnosis not present

## 2016-01-17 DIAGNOSIS — M545 Low back pain: Secondary | ICD-10-CM | POA: Diagnosis not present

## 2016-01-20 DIAGNOSIS — M5416 Radiculopathy, lumbar region: Secondary | ICD-10-CM | POA: Diagnosis not present

## 2016-01-20 DIAGNOSIS — M546 Pain in thoracic spine: Secondary | ICD-10-CM | POA: Diagnosis not present

## 2016-01-20 DIAGNOSIS — M545 Low back pain: Secondary | ICD-10-CM | POA: Diagnosis not present

## 2016-01-20 DIAGNOSIS — M79604 Pain in right leg: Secondary | ICD-10-CM | POA: Diagnosis not present

## 2016-01-22 DIAGNOSIS — M79604 Pain in right leg: Secondary | ICD-10-CM | POA: Diagnosis not present

## 2016-01-22 DIAGNOSIS — M546 Pain in thoracic spine: Secondary | ICD-10-CM | POA: Diagnosis not present

## 2016-01-22 DIAGNOSIS — M545 Low back pain: Secondary | ICD-10-CM | POA: Diagnosis not present

## 2016-01-22 DIAGNOSIS — M5416 Radiculopathy, lumbar region: Secondary | ICD-10-CM | POA: Diagnosis not present

## 2016-01-31 DIAGNOSIS — M546 Pain in thoracic spine: Secondary | ICD-10-CM | POA: Diagnosis not present

## 2016-01-31 DIAGNOSIS — M79604 Pain in right leg: Secondary | ICD-10-CM | POA: Diagnosis not present

## 2016-01-31 DIAGNOSIS — M545 Low back pain: Secondary | ICD-10-CM | POA: Diagnosis not present

## 2016-01-31 DIAGNOSIS — M5416 Radiculopathy, lumbar region: Secondary | ICD-10-CM | POA: Diagnosis not present

## 2016-02-03 DIAGNOSIS — M545 Low back pain: Secondary | ICD-10-CM | POA: Diagnosis not present

## 2016-02-03 DIAGNOSIS — M79604 Pain in right leg: Secondary | ICD-10-CM | POA: Diagnosis not present

## 2016-02-03 DIAGNOSIS — M546 Pain in thoracic spine: Secondary | ICD-10-CM | POA: Diagnosis not present

## 2016-02-03 DIAGNOSIS — M5416 Radiculopathy, lumbar region: Secondary | ICD-10-CM | POA: Diagnosis not present

## 2016-02-05 DIAGNOSIS — M79604 Pain in right leg: Secondary | ICD-10-CM | POA: Diagnosis not present

## 2016-02-05 DIAGNOSIS — M545 Low back pain: Secondary | ICD-10-CM | POA: Diagnosis not present

## 2016-02-05 DIAGNOSIS — M546 Pain in thoracic spine: Secondary | ICD-10-CM | POA: Diagnosis not present

## 2016-02-05 DIAGNOSIS — M5416 Radiculopathy, lumbar region: Secondary | ICD-10-CM | POA: Diagnosis not present

## 2016-02-10 DIAGNOSIS — M5416 Radiculopathy, lumbar region: Secondary | ICD-10-CM | POA: Diagnosis not present

## 2016-02-10 DIAGNOSIS — M546 Pain in thoracic spine: Secondary | ICD-10-CM | POA: Diagnosis not present

## 2016-02-10 DIAGNOSIS — M545 Low back pain: Secondary | ICD-10-CM | POA: Diagnosis not present

## 2016-02-10 DIAGNOSIS — M79604 Pain in right leg: Secondary | ICD-10-CM | POA: Diagnosis not present

## 2016-02-19 DIAGNOSIS — Z23 Encounter for immunization: Secondary | ICD-10-CM | POA: Diagnosis not present

## 2016-04-21 DIAGNOSIS — M419 Scoliosis, unspecified: Secondary | ICD-10-CM | POA: Diagnosis not present

## 2016-04-21 DIAGNOSIS — M47816 Spondylosis without myelopathy or radiculopathy, lumbar region: Secondary | ICD-10-CM | POA: Diagnosis not present

## 2016-04-21 DIAGNOSIS — M545 Low back pain: Secondary | ICD-10-CM | POA: Diagnosis not present

## 2016-07-16 DIAGNOSIS — L02821 Furuncle of head [any part, except face]: Secondary | ICD-10-CM | POA: Diagnosis not present

## 2016-07-16 DIAGNOSIS — B9689 Other specified bacterial agents as the cause of diseases classified elsewhere: Secondary | ICD-10-CM | POA: Diagnosis not present

## 2016-07-16 DIAGNOSIS — D234 Other benign neoplasm of skin of scalp and neck: Secondary | ICD-10-CM | POA: Diagnosis not present

## 2016-07-16 DIAGNOSIS — D210 Benign neoplasm of connective and other soft tissue of head, face and neck: Secondary | ICD-10-CM | POA: Diagnosis not present

## 2016-08-04 DIAGNOSIS — M47816 Spondylosis without myelopathy or radiculopathy, lumbar region: Secondary | ICD-10-CM | POA: Diagnosis not present

## 2016-08-04 DIAGNOSIS — M545 Low back pain: Secondary | ICD-10-CM | POA: Diagnosis not present

## 2016-08-04 DIAGNOSIS — M419 Scoliosis, unspecified: Secondary | ICD-10-CM | POA: Diagnosis not present

## 2017-01-14 DIAGNOSIS — C44119 Basal cell carcinoma of skin of left eyelid, including canthus: Secondary | ICD-10-CM | POA: Diagnosis not present

## 2017-02-09 DIAGNOSIS — H3563 Retinal hemorrhage, bilateral: Secondary | ICD-10-CM | POA: Diagnosis not present

## 2017-02-09 DIAGNOSIS — H52223 Regular astigmatism, bilateral: Secondary | ICD-10-CM | POA: Diagnosis not present

## 2017-02-09 DIAGNOSIS — H5203 Hypermetropia, bilateral: Secondary | ICD-10-CM | POA: Diagnosis not present

## 2017-02-15 DIAGNOSIS — Z23 Encounter for immunization: Secondary | ICD-10-CM | POA: Diagnosis not present

## 2017-03-11 DIAGNOSIS — C441192 Basal cell carcinoma of skin of left lower eyelid, including canthus: Secondary | ICD-10-CM | POA: Diagnosis not present

## 2017-07-13 DIAGNOSIS — M47816 Spondylosis without myelopathy or radiculopathy, lumbar region: Secondary | ICD-10-CM | POA: Diagnosis not present

## 2017-07-15 DIAGNOSIS — Z85828 Personal history of other malignant neoplasm of skin: Secondary | ICD-10-CM | POA: Diagnosis not present

## 2017-07-15 DIAGNOSIS — Z08 Encounter for follow-up examination after completed treatment for malignant neoplasm: Secondary | ICD-10-CM | POA: Diagnosis not present

## 2017-07-15 DIAGNOSIS — D225 Melanocytic nevi of trunk: Secondary | ICD-10-CM | POA: Diagnosis not present

## 2017-10-21 DIAGNOSIS — M25561 Pain in right knee: Secondary | ICD-10-CM | POA: Diagnosis not present

## 2017-10-21 DIAGNOSIS — M25861 Other specified joint disorders, right knee: Secondary | ICD-10-CM | POA: Diagnosis not present

## 2017-11-10 ENCOUNTER — Other Ambulatory Visit (HOSPITAL_COMMUNITY): Payer: Self-pay | Admitting: Neurosurgery

## 2017-11-10 DIAGNOSIS — M259 Joint disorder, unspecified: Secondary | ICD-10-CM

## 2017-11-17 ENCOUNTER — Ambulatory Visit (HOSPITAL_COMMUNITY)
Admission: RE | Admit: 2017-11-17 | Discharge: 2017-11-17 | Disposition: A | Discharge status: Home / Self Care | Payer: Medicare Other | Source: Ambulatory Visit | Attending: Neurosurgery | Admitting: Neurosurgery

## 2017-11-17 DIAGNOSIS — X58XXXA Exposure to other specified factors, initial encounter: Secondary | ICD-10-CM | POA: Insufficient documentation

## 2017-11-17 DIAGNOSIS — M25461 Effusion, right knee: Secondary | ICD-10-CM | POA: Insufficient documentation

## 2017-11-17 DIAGNOSIS — M259 Joint disorder, unspecified: Secondary | ICD-10-CM

## 2017-11-17 DIAGNOSIS — S83281A Other tear of lateral meniscus, current injury, right knee, initial encounter: Secondary | ICD-10-CM | POA: Diagnosis not present

## 2017-11-17 DIAGNOSIS — M25561 Pain in right knee: Secondary | ICD-10-CM | POA: Diagnosis not present

## 2017-12-01 DIAGNOSIS — I10 Essential (primary) hypertension: Secondary | ICD-10-CM | POA: Diagnosis not present

## 2017-12-01 DIAGNOSIS — Z79899 Other long term (current) drug therapy: Secondary | ICD-10-CM | POA: Diagnosis not present

## 2017-12-01 DIAGNOSIS — E6609 Other obesity due to excess calories: Secondary | ICD-10-CM | POA: Diagnosis not present

## 2017-12-08 DIAGNOSIS — I10 Essential (primary) hypertension: Secondary | ICD-10-CM | POA: Diagnosis not present

## 2017-12-08 DIAGNOSIS — M5136 Other intervertebral disc degeneration, lumbar region: Secondary | ICD-10-CM | POA: Diagnosis not present

## 2017-12-08 DIAGNOSIS — Z6838 Body mass index (BMI) 38.0-38.9, adult: Secondary | ICD-10-CM | POA: Diagnosis not present

## 2017-12-29 ENCOUNTER — Encounter: Payer: Self-pay | Admitting: Internal Medicine

## 2018-01-06 DIAGNOSIS — M25561 Pain in right knee: Secondary | ICD-10-CM | POA: Diagnosis not present

## 2018-01-06 DIAGNOSIS — M1711 Unilateral primary osteoarthritis, right knee: Secondary | ICD-10-CM | POA: Diagnosis not present

## 2018-02-18 DIAGNOSIS — Z23 Encounter for immunization: Secondary | ICD-10-CM | POA: Diagnosis not present

## 2018-04-14 DIAGNOSIS — M1711 Unilateral primary osteoarthritis, right knee: Secondary | ICD-10-CM | POA: Diagnosis not present

## 2018-07-15 DIAGNOSIS — M1711 Unilateral primary osteoarthritis, right knee: Secondary | ICD-10-CM | POA: Diagnosis not present

## 2018-10-13 DIAGNOSIS — M1711 Unilateral primary osteoarthritis, right knee: Secondary | ICD-10-CM | POA: Diagnosis not present

## 2018-12-08 DIAGNOSIS — H3563 Retinal hemorrhage, bilateral: Secondary | ICD-10-CM | POA: Diagnosis not present

## 2019-01-13 DIAGNOSIS — M1711 Unilateral primary osteoarthritis, right knee: Secondary | ICD-10-CM | POA: Diagnosis not present

## 2019-02-20 DIAGNOSIS — Z23 Encounter for immunization: Secondary | ICD-10-CM | POA: Diagnosis not present

## 2019-04-21 DIAGNOSIS — M1711 Unilateral primary osteoarthritis, right knee: Secondary | ICD-10-CM | POA: Diagnosis not present

## 2019-06-03 ENCOUNTER — Ambulatory Visit: Payer: Medicare Other

## 2019-06-03 ENCOUNTER — Ambulatory Visit: Payer: Medicare Other | Attending: Internal Medicine

## 2019-06-03 DIAGNOSIS — Z23 Encounter for immunization: Secondary | ICD-10-CM | POA: Insufficient documentation

## 2019-06-03 NOTE — Progress Notes (Signed)
   Covid-19 Vaccination Clinic  Name:  Jerry Villanueva    MRN: [id number] DOB: 1941-01-25  06/03/2019  [name] was observed post Covid-19 immunization for 15 minutes without incidence. He was provided with Vaccine Information Sheet and instruction to access the V-Safe system.   [name] was instructed to call 911 with any severe reactions post vaccine: Marland Kitchen Difficulty breathing  . Swelling of your face and throat  . A fast heartbeat  . A bad rash all over your body  . Dizziness and weakness    Immunizations Administered    Name Date Dose VIS Date Route   Pfizer COVID-19 Vaccine 06/03/2019 12:24 PM 0.3 mL 04/21/2019 Intramuscular   Manufacturer: [place]   Lot: [id number]   [place]: [id number]

## 2019-06-12 ENCOUNTER — Ambulatory Visit: Payer: Medicare Other

## 2019-06-24 ENCOUNTER — Ambulatory Visit: Payer: Medicare Other

## 2019-06-25 ENCOUNTER — Ambulatory Visit: Payer: Medicare Other | Attending: Internal Medicine

## 2019-06-25 DIAGNOSIS — Z23 Encounter for immunization: Secondary | ICD-10-CM | POA: Insufficient documentation

## 2019-06-25 NOTE — Progress Notes (Signed)
   Covid-19 Vaccination Clinic  Name:  Jerry Villanueva    MRN: [id number] DOB: Nov 27, 1940  06/25/2019  Mr. Canche was observed post Covid-19 immunization for 15 minutes without incidence. He was provided with Vaccine Information Sheet and instruction to access the V-Safe system.   Mr. Bracknell was instructed to call 911 with any severe reactions post vaccine: Marland Kitchen Difficulty breathing  . Swelling of your face and throat  . A fast heartbeat  . A bad rash all over your body  . Dizziness and weakness    Immunizations Administered    Name Date Dose VIS Date Route   Pfizer COVID-19 Vaccine 06/25/2019 12:00 PM 0.3 mL 04/21/2019 Intramuscular   Manufacturer: [place]   Lot: [id number]   [place]: [id number]

## 2019-07-20 DIAGNOSIS — M1711 Unilateral primary osteoarthritis, right knee: Secondary | ICD-10-CM | POA: Diagnosis not present

## 2019-10-19 DIAGNOSIS — M25561 Pain in right knee: Secondary | ICD-10-CM | POA: Diagnosis not present

## 2019-10-19 DIAGNOSIS — M1711 Unilateral primary osteoarthritis, right knee: Secondary | ICD-10-CM | POA: Diagnosis not present

## 2020-01-26 DIAGNOSIS — M1711 Unilateral primary osteoarthritis, right knee: Secondary | ICD-10-CM | POA: Diagnosis not present

## 2020-02-29 DIAGNOSIS — Z23 Encounter for immunization: Secondary | ICD-10-CM | POA: Diagnosis not present

## 2020-03-21 DIAGNOSIS — Z23 Encounter for immunization: Secondary | ICD-10-CM | POA: Diagnosis not present

## 2020-04-12 DIAGNOSIS — M1711 Unilateral primary osteoarthritis, right knee: Secondary | ICD-10-CM | POA: Diagnosis not present

## 2020-05-23 IMAGING — MR MR KNEE*R* W/O CM
4 of 6 series · 20 of 40 positions shown · non-contrast
Comparison: None.

CLINICAL DATA: Right knee pain for 3 years.  No known injury.

EXAM:
MRI OF THE RIGHT KNEE WITHOUT CONTRAST
TECHNIQUE: Multiplanar, multisequence MR imaging of the knee was performed. No
intravenous contrast was administered.

[Series 3: t2fs axial · axial · 4.0mm · 0.50mm/px · z∈[-106,+8]mm · 5 of 24 slices shown]
[im 1/24]
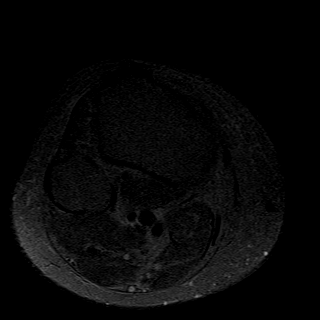
[im 6/24]
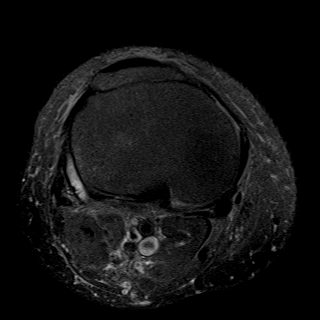
[im 12/24]
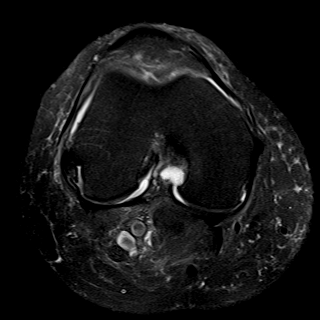
[im 18/24]
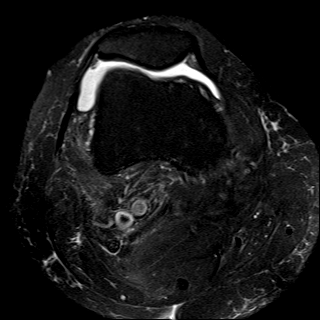
[im 24/24]
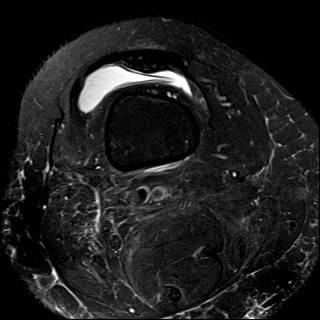

[Series 4: T1 · coronal · 4.0mm · 0.29mm/px · 8 of 32 slices shown]
[im 1/32]
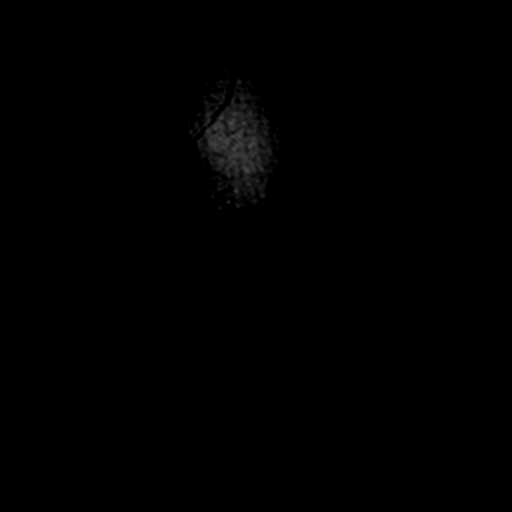
[im 5/32]
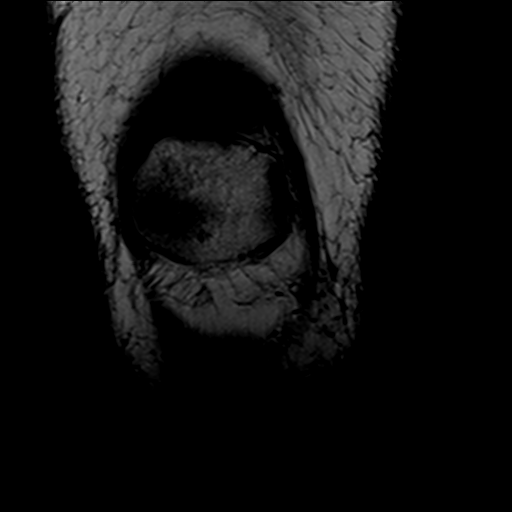
[im 9/32]
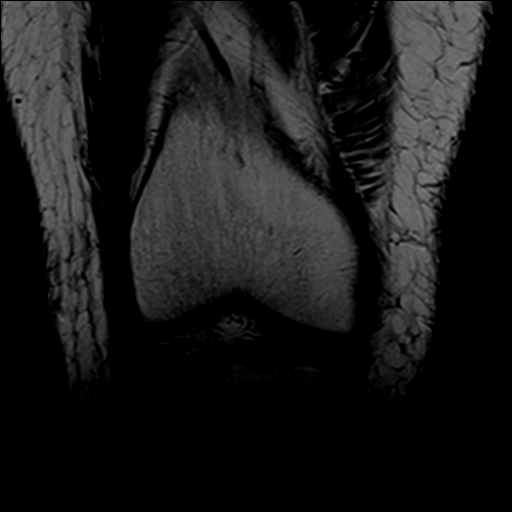
[im 14/32]
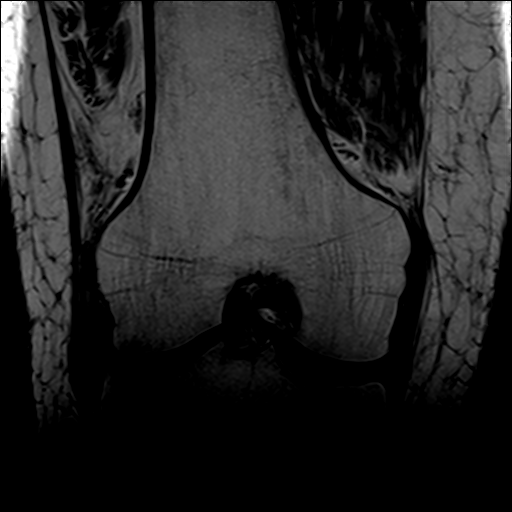
[im 18/32]
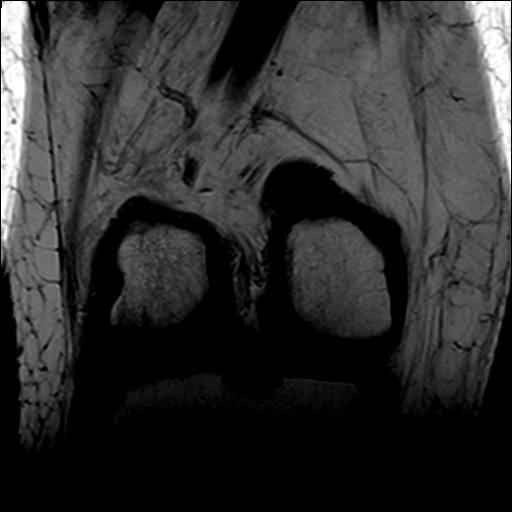
[im 23/32]
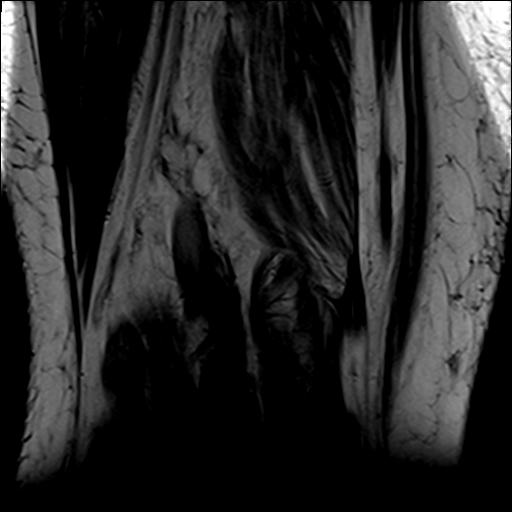
[im 27/32]
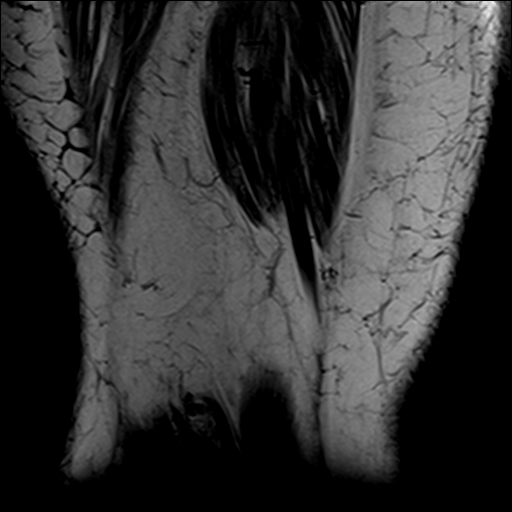
[im 32/32]
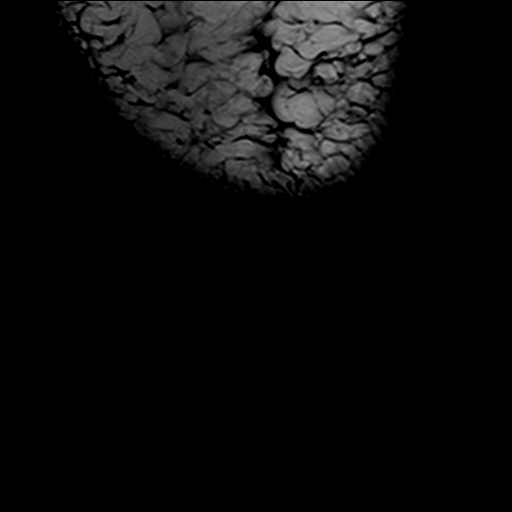

[Series 5: pdfs sag · sagittal · 3.0mm · 0.23mm/px · 4 of 29 slices shown]
[im 1/29]
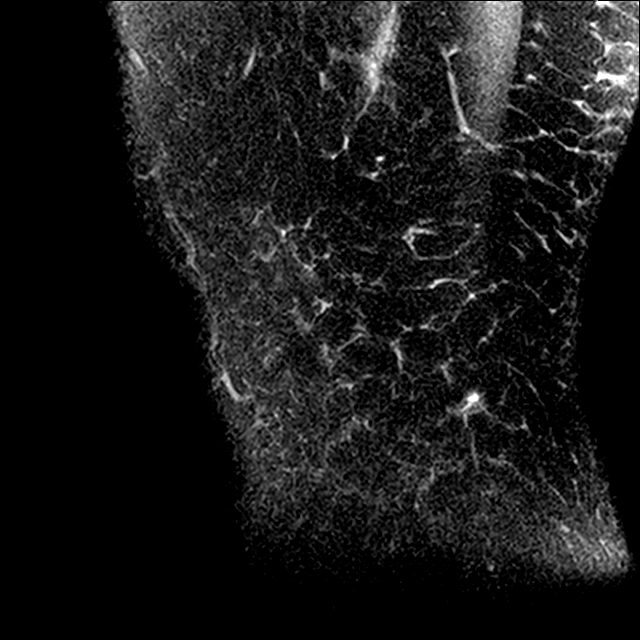
[im 5/29]
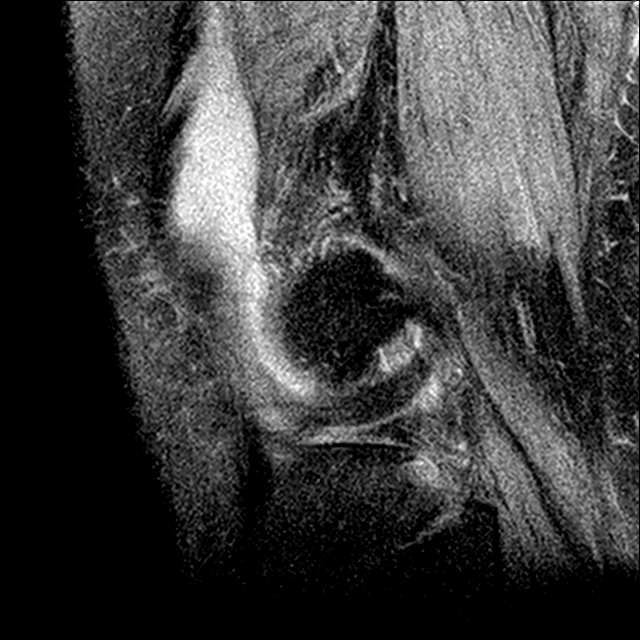
[im 15/29]
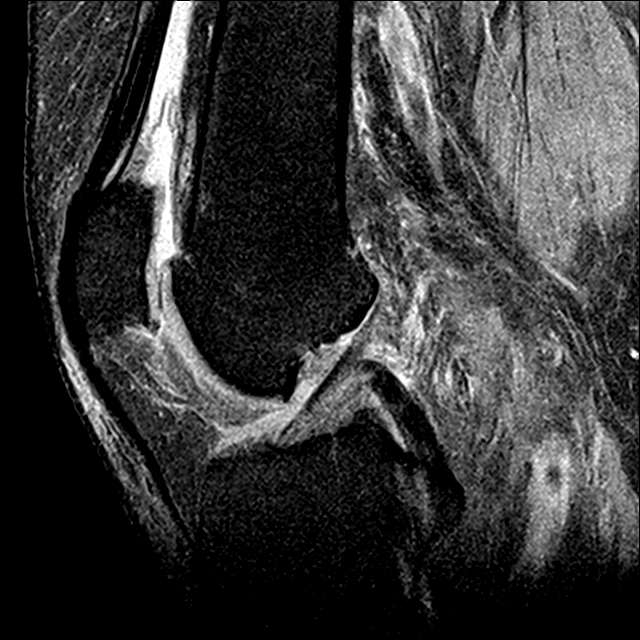
[im 24/29]
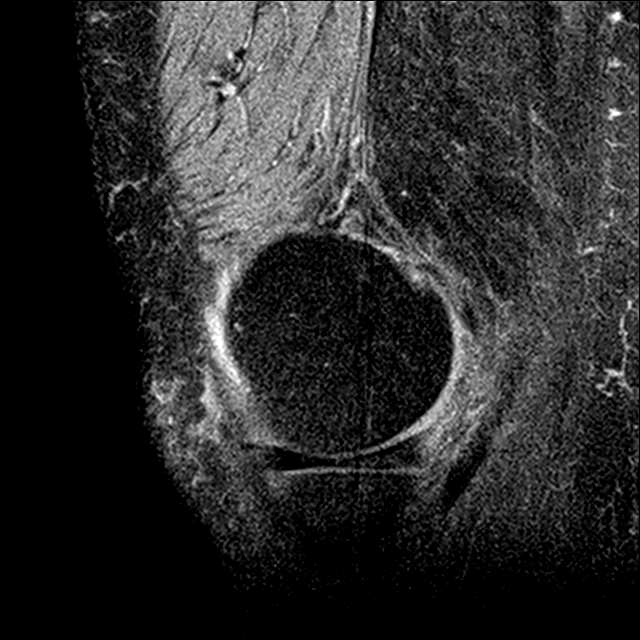

[Series 6: t2fs cor · coronal · 4.0mm · 0.30mm/px · 3 of 32 slices shown]
[im 5/32]
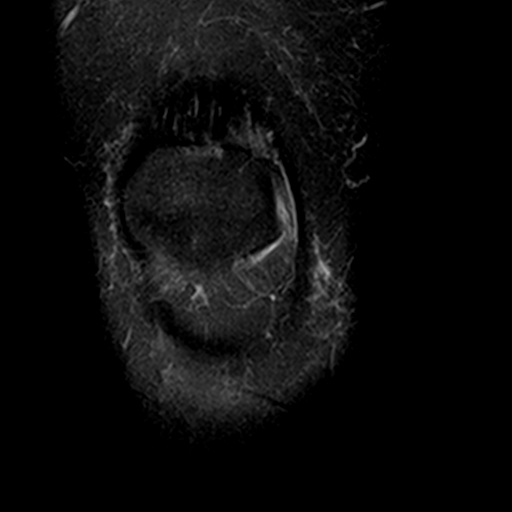
[im 18/32]
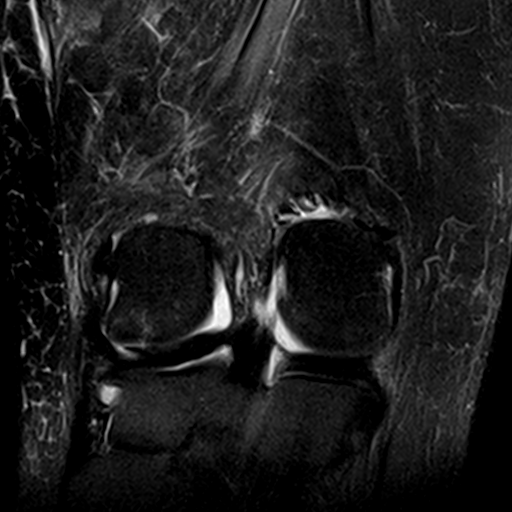
[im 27/32]
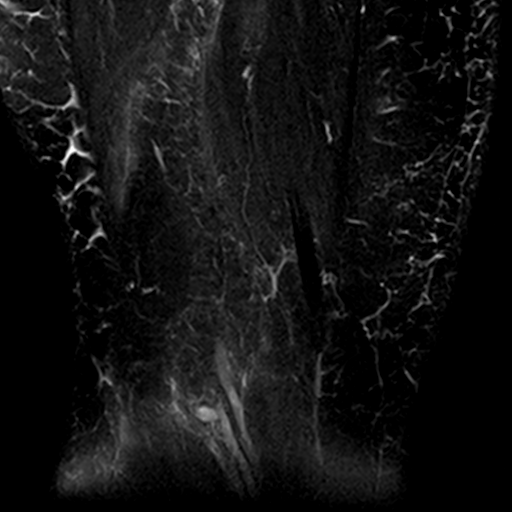

[20 of 40 positions shown; findings below may reference images not displayed]

FINDINGS: MENISCI

Medial meniscus:  Intact.

Lateral meniscus: Severe radial tear of the body of lateral
meniscus. Oblique tear of anterior horn of the lateral meniscus
extending to the inferior articular surface.

LIGAMENTS

Cruciates:  Intact ACL and PCL.

Collaterals: Medial collateral ligament is intact. Lateral
collateral ligament complex is intact.

CARTILAGE

Patellofemoral: Partial-thickness cartilage loss of the
patellofemoral compartment.

Medial:  No chondral defect.

Lateral: High-grade partial-thickness cartilage loss with areas of
full-thickness cartilage loss of the lateral femoral condyle and
lateral tibial plateau with mild subchondral reactive marrow
changes.

Joint: Large joint effusion. Minimal edema in Hoffa's fat. No plical
thickening.

Popliteal Fossa:  Tiny Baker's cyst.  Intact popliteus tendon.

Extensor Mechanism: Intact quadriceps tendon. Intact patellar
tendon. Intact medial patellar retinaculum. Intact lateral patellar
retinaculum. Intact MPFL.

Bones:  No acute osseous abnormality.  No aggressive osseous lesion.

Other: No fluid collection or hematoma.
IMPRESSION: 1. Severe radial tear of the body of lateral meniscus. Oblique tear
of anterior horn of the lateral meniscus extending to the inferior
articular surface.
2. High-grade partial-thickness cartilage loss with areas of
full-thickness cartilage loss of the lateral femoral condyle and
lateral tibial plateau with mild subchondral reactive marrow
changes.
3. Large joint effusion.

## 2020-07-09 DIAGNOSIS — Z79899 Other long term (current) drug therapy: Secondary | ICD-10-CM | POA: Diagnosis not present

## 2020-07-09 DIAGNOSIS — I10 Essential (primary) hypertension: Secondary | ICD-10-CM | POA: Diagnosis not present

## 2020-07-16 DIAGNOSIS — N1831 Chronic kidney disease, stage 3a: Secondary | ICD-10-CM | POA: Diagnosis not present

## 2020-07-16 DIAGNOSIS — I10 Essential (primary) hypertension: Secondary | ICD-10-CM | POA: Diagnosis not present

## 2020-07-19 DIAGNOSIS — M1711 Unilateral primary osteoarthritis, right knee: Secondary | ICD-10-CM | POA: Diagnosis not present

## 2020-10-18 DIAGNOSIS — M1711 Unilateral primary osteoarthritis, right knee: Secondary | ICD-10-CM | POA: Diagnosis not present

## 2021-01-09 DIAGNOSIS — Z79899 Other long term (current) drug therapy: Secondary | ICD-10-CM | POA: Diagnosis not present

## 2021-01-09 DIAGNOSIS — N189 Chronic kidney disease, unspecified: Secondary | ICD-10-CM | POA: Diagnosis not present

## 2021-01-09 DIAGNOSIS — I10 Essential (primary) hypertension: Secondary | ICD-10-CM | POA: Diagnosis not present

## 2021-01-16 DIAGNOSIS — N178 Other acute kidney failure: Secondary | ICD-10-CM | POA: Diagnosis not present

## 2021-01-16 DIAGNOSIS — I10 Essential (primary) hypertension: Secondary | ICD-10-CM | POA: Diagnosis not present

## 2021-01-17 DIAGNOSIS — M1711 Unilateral primary osteoarthritis, right knee: Secondary | ICD-10-CM | POA: Diagnosis not present

## 2021-03-13 DIAGNOSIS — Z23 Encounter for immunization: Secondary | ICD-10-CM | POA: Diagnosis not present

## 2021-04-25 DIAGNOSIS — M1711 Unilateral primary osteoarthritis, right knee: Secondary | ICD-10-CM | POA: Diagnosis not present

## 2021-05-20 DIAGNOSIS — H04123 Dry eye syndrome of bilateral lacrimal glands: Secondary | ICD-10-CM | POA: Diagnosis not present

## 2021-07-17 DIAGNOSIS — M1711 Unilateral primary osteoarthritis, right knee: Secondary | ICD-10-CM | POA: Diagnosis not present

## 2021-10-22 DIAGNOSIS — M1711 Unilateral primary osteoarthritis, right knee: Secondary | ICD-10-CM | POA: Diagnosis not present

## 2021-11-19 ENCOUNTER — Ambulatory Visit (HOSPITAL_COMMUNITY)
Admission: RE | Admit: 2021-11-19 | Discharge: 2021-11-19 | Disposition: A | Discharge status: Home / Self Care | Payer: Medicare Other | Source: Ambulatory Visit | Attending: Internal Medicine | Admitting: Internal Medicine

## 2021-11-19 ENCOUNTER — Other Ambulatory Visit (HOSPITAL_COMMUNITY): Payer: Self-pay | Admitting: Internal Medicine

## 2021-11-19 DIAGNOSIS — M79645 Pain in left finger(s): Secondary | ICD-10-CM

## 2021-11-19 DIAGNOSIS — M1812 Unilateral primary osteoarthritis of first carpometacarpal joint, left hand: Secondary | ICD-10-CM | POA: Diagnosis not present

## 2022-02-05 DIAGNOSIS — M1711 Unilateral primary osteoarthritis, right knee: Secondary | ICD-10-CM | POA: Diagnosis not present

## 2022-02-24 DIAGNOSIS — Z23 Encounter for immunization: Secondary | ICD-10-CM | POA: Diagnosis not present

## 2022-05-08 DIAGNOSIS — M1711 Unilateral primary osteoarthritis, right knee: Secondary | ICD-10-CM | POA: Diagnosis not present

## 2022-05-21 DIAGNOSIS — H04123 Dry eye syndrome of bilateral lacrimal glands: Secondary | ICD-10-CM | POA: Diagnosis not present

## 2022-07-15 DIAGNOSIS — M1711 Unilateral primary osteoarthritis, right knee: Secondary | ICD-10-CM | POA: Diagnosis not present

## 2022-07-22 DIAGNOSIS — M1711 Unilateral primary osteoarthritis, right knee: Secondary | ICD-10-CM | POA: Diagnosis not present

## 2022-07-29 DIAGNOSIS — M1711 Unilateral primary osteoarthritis, right knee: Secondary | ICD-10-CM | POA: Diagnosis not present

## 2022-09-11 DIAGNOSIS — M1711 Unilateral primary osteoarthritis, right knee: Secondary | ICD-10-CM | POA: Diagnosis not present

## 2023-03-18 DIAGNOSIS — M1711 Unilateral primary osteoarthritis, right knee: Secondary | ICD-10-CM | POA: Diagnosis not present

## 2023-03-25 DIAGNOSIS — Z23 Encounter for immunization: Secondary | ICD-10-CM | POA: Diagnosis not present

## 2023-03-25 DIAGNOSIS — M1711 Unilateral primary osteoarthritis, right knee: Secondary | ICD-10-CM | POA: Diagnosis not present

## 2023-04-01 DIAGNOSIS — M1711 Unilateral primary osteoarthritis, right knee: Secondary | ICD-10-CM | POA: Diagnosis not present

## 2023-05-27 DIAGNOSIS — H04123 Dry eye syndrome of bilateral lacrimal glands: Secondary | ICD-10-CM | POA: Diagnosis not present

## 2024-02-09 DIAGNOSIS — Z23 Encounter for immunization: Secondary | ICD-10-CM | POA: Diagnosis not present

## 2024-03-09 DIAGNOSIS — M25561 Pain in right knee: Secondary | ICD-10-CM | POA: Diagnosis not present

## 2024-03-09 DIAGNOSIS — M1711 Unilateral primary osteoarthritis, right knee: Secondary | ICD-10-CM | POA: Diagnosis not present

## 2024-03-16 DIAGNOSIS — M1711 Unilateral primary osteoarthritis, right knee: Secondary | ICD-10-CM | POA: Diagnosis not present

## 2024-03-23 DIAGNOSIS — M1711 Unilateral primary osteoarthritis, right knee: Secondary | ICD-10-CM | POA: Diagnosis not present
